# Patient Record
Sex: Male | Born: 1960 | Race: White | Hispanic: No | Marital: Married | Smoking: Never smoker
Health system: Southern US, Community
[De-identification: ages and names within clinical notes are randomized; demographics above are authoritative.]

---

## 2003-06-26 ENCOUNTER — Encounter: Admission: RE | Admit: 2003-06-26 | Discharge: 2003-06-26 | Payer: Self-pay | Admitting: Internal Medicine

## 2004-12-22 IMAGING — CT CT PARANASAL SINUSES LIMITED
1 series · 16 of 28 positions shown, 20 images · non-contrast
Comparison: none

CLINICAL DATA: Persistent sinusitis. 
 CT SINUS LIMITED WITHOUT CONTRAST 
 There are air fluid levels seen within the inferior portions of the maxillary sinuses bilaterally consistent with bilateral maxillary sinusitis.  There is also a small amount of fluid seen within the anterior left sphenoid sinus and mild mucosal thickening seen within the anterior ethmoid sinuses.  The frontal sinuses appear normally aerated.  There are no areas of bone destruction.
 IMPRESSION
 The findings are consistent with bilateral maxillary sinusitis and a small amount of fluid is also seen within the anterior left sphenoid sinus with mucosal thickening within the anterior ethmoid sinuses.

[Series 2: limited sinus · axial · 0.33mm/px · z∈[-8,+91]mm · 16 of 28 slices shown, 20 images]
[im 2/28  brain]
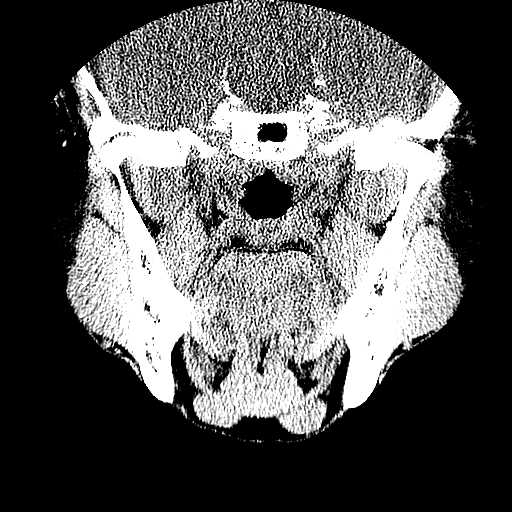
[im 2/28  bone]
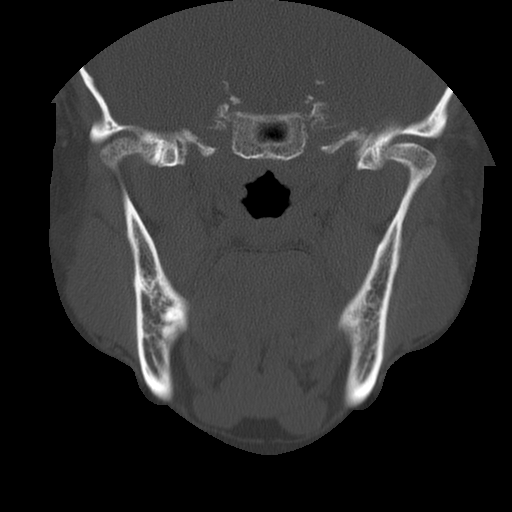
[im 4/28  bone]
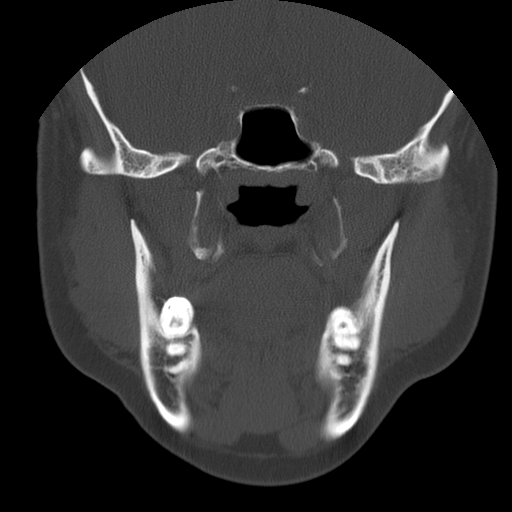
[im 6/28  bone]
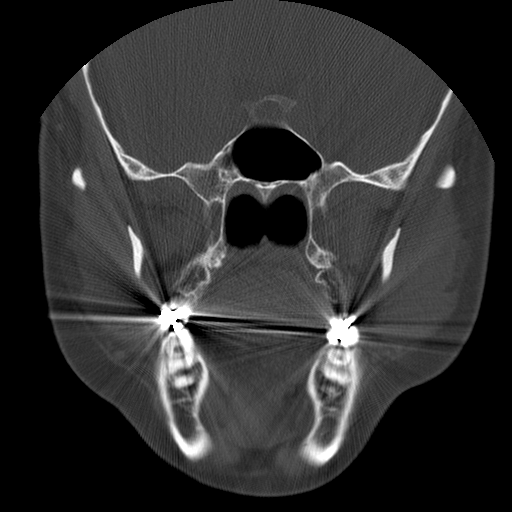
[im 7/28  bone]
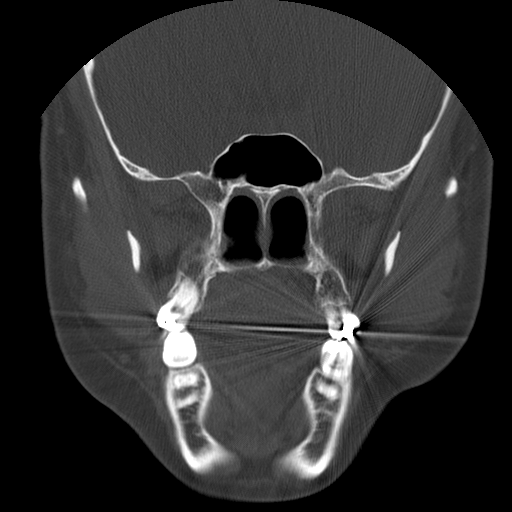
[im 9/28  brain]
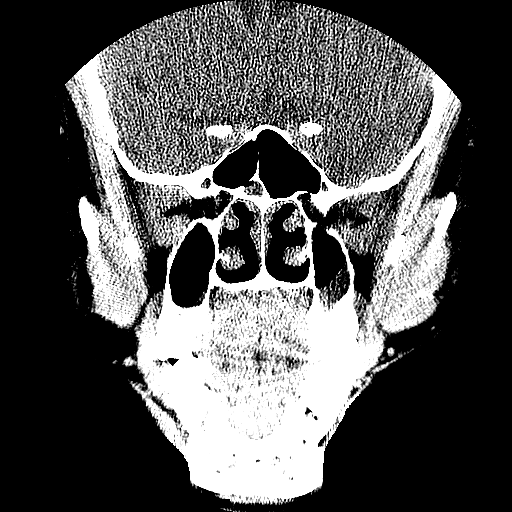
[im 9/28  bone]
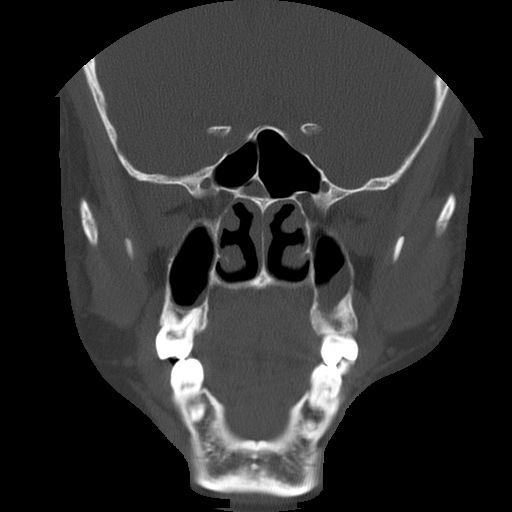
[im 10/28  bone]
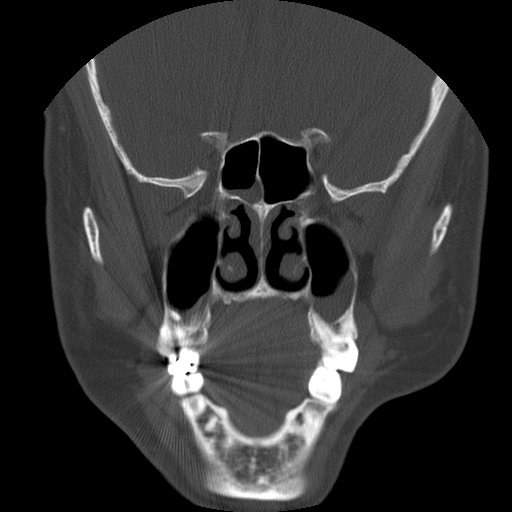
[im 12/28  bone]
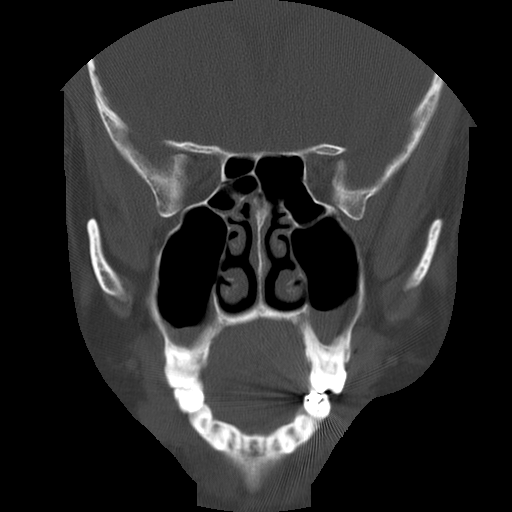
[im 14/28  bone]
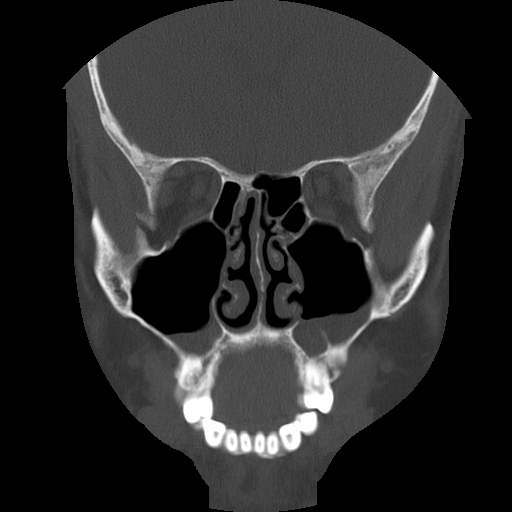
[im 15/28  brain]
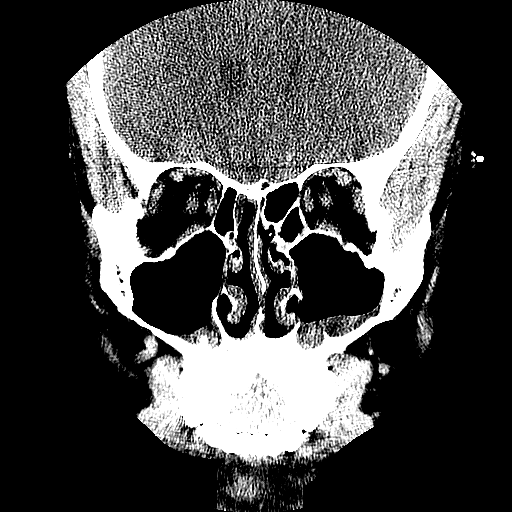
[im 15/28  bone]
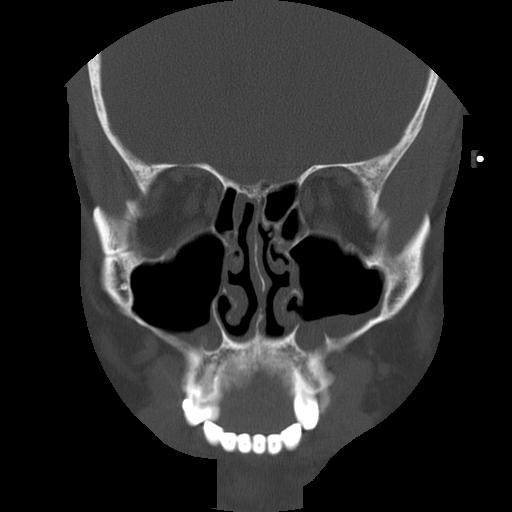
[im 17/28  bone]
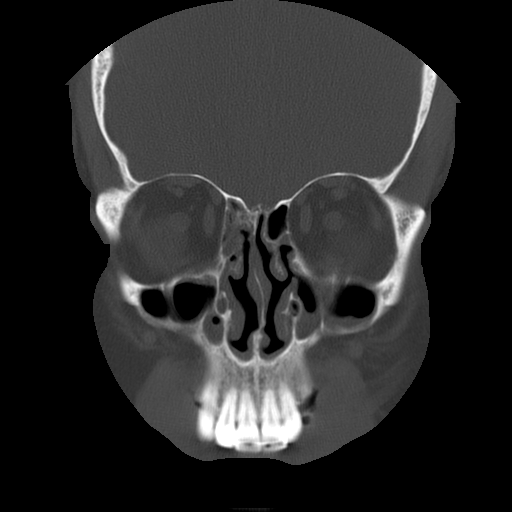
[im 19/28  bone]
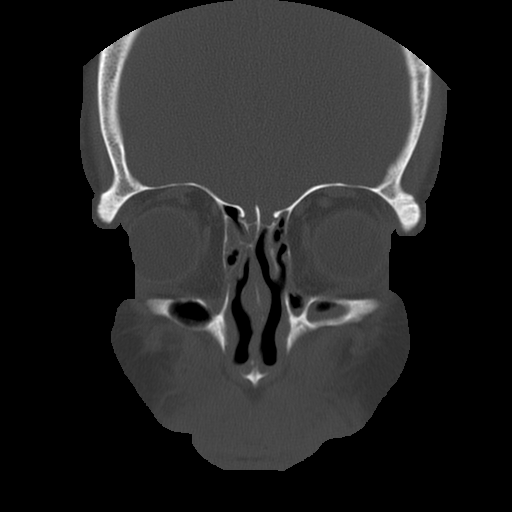
[im 20/28  bone]
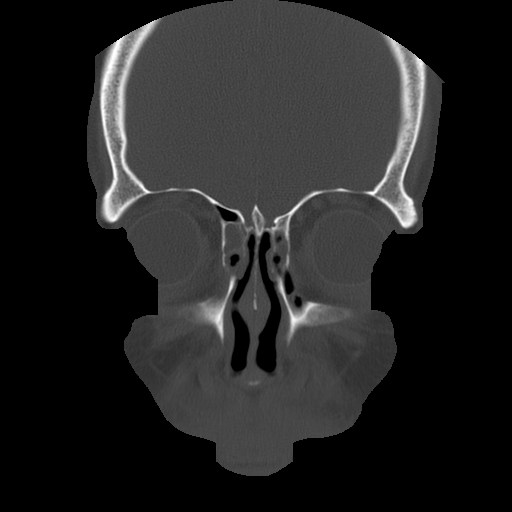
[im 22/28  brain]
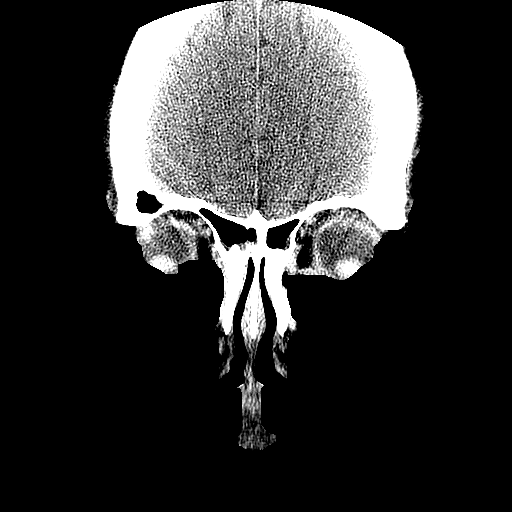
[im 22/28  bone]
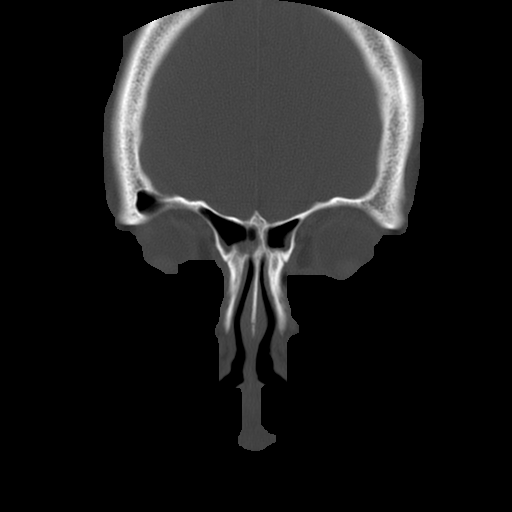
[im 23/28  bone]
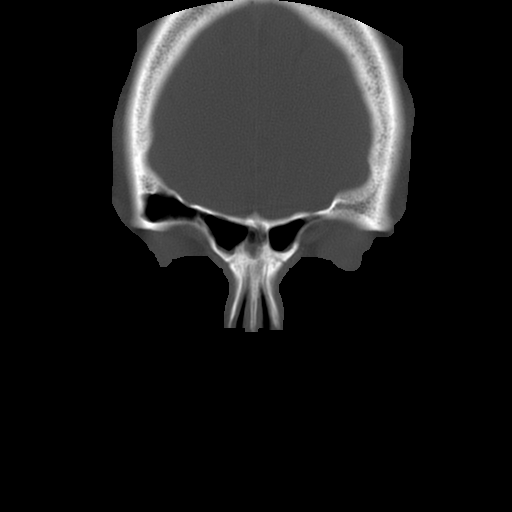
[im 25/28  bone]
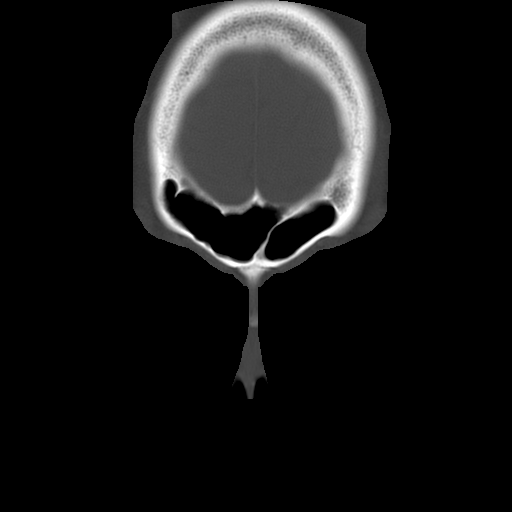
[im 27/28  bone]
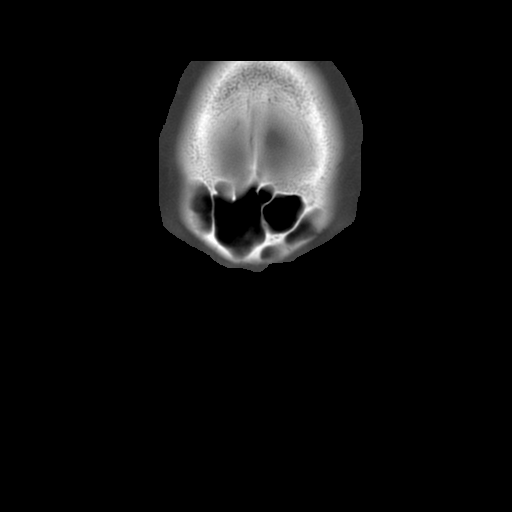

[16 of 28 positions shown; findings below may reference images not displayed]

## 2005-10-06 ENCOUNTER — Ambulatory Visit (HOSPITAL_COMMUNITY): Admission: RE | Admit: 2005-10-06 | Discharge: 2005-10-06 | Payer: Self-pay | Admitting: Cardiology

## 2005-10-20 ENCOUNTER — Ambulatory Visit (HOSPITAL_COMMUNITY): Admission: RE | Admit: 2005-10-20 | Discharge: 2005-10-20 | Payer: Self-pay | Admitting: Cardiology

## 2018-08-15 DIAGNOSIS — I10 Essential (primary) hypertension: Secondary | ICD-10-CM | POA: Diagnosis not present

## 2018-08-15 DIAGNOSIS — F5101 Primary insomnia: Secondary | ICD-10-CM | POA: Diagnosis not present

## 2018-08-15 DIAGNOSIS — J3089 Other allergic rhinitis: Secondary | ICD-10-CM | POA: Diagnosis not present

## 2018-12-18 DIAGNOSIS — R0981 Nasal congestion: Secondary | ICD-10-CM | POA: Diagnosis not present

## 2018-12-18 DIAGNOSIS — R0982 Postnasal drip: Secondary | ICD-10-CM | POA: Diagnosis not present

## 2018-12-18 DIAGNOSIS — I10 Essential (primary) hypertension: Secondary | ICD-10-CM | POA: Diagnosis not present

## 2018-12-18 DIAGNOSIS — E668 Other obesity: Secondary | ICD-10-CM | POA: Diagnosis not present

## 2018-12-18 DIAGNOSIS — Z6839 Body mass index (BMI) 39.0-39.9, adult: Secondary | ICD-10-CM | POA: Diagnosis not present

## 2018-12-18 DIAGNOSIS — J3089 Other allergic rhinitis: Secondary | ICD-10-CM | POA: Diagnosis not present

## 2019-02-18 DIAGNOSIS — R7989 Other specified abnormal findings of blood chemistry: Secondary | ICD-10-CM | POA: Diagnosis not present

## 2019-02-18 DIAGNOSIS — Z0001 Encounter for general adult medical examination with abnormal findings: Secondary | ICD-10-CM | POA: Diagnosis not present

## 2019-02-18 DIAGNOSIS — E782 Mixed hyperlipidemia: Secondary | ICD-10-CM | POA: Diagnosis not present

## 2019-02-18 DIAGNOSIS — Z125 Encounter for screening for malignant neoplasm of prostate: Secondary | ICD-10-CM | POA: Diagnosis not present

## 2019-02-19 DIAGNOSIS — F5101 Primary insomnia: Secondary | ICD-10-CM | POA: Diagnosis not present

## 2019-02-19 DIAGNOSIS — L918 Other hypertrophic disorders of the skin: Secondary | ICD-10-CM | POA: Diagnosis not present

## 2019-02-19 DIAGNOSIS — Z6838 Body mass index (BMI) 38.0-38.9, adult: Secondary | ICD-10-CM | POA: Diagnosis not present

## 2019-02-19 DIAGNOSIS — Z23 Encounter for immunization: Secondary | ICD-10-CM | POA: Diagnosis not present

## 2019-02-19 DIAGNOSIS — I10 Essential (primary) hypertension: Secondary | ICD-10-CM | POA: Diagnosis not present

## 2019-02-19 DIAGNOSIS — Z1211 Encounter for screening for malignant neoplasm of colon: Secondary | ICD-10-CM | POA: Diagnosis not present

## 2019-02-19 DIAGNOSIS — R7301 Impaired fasting glucose: Secondary | ICD-10-CM | POA: Diagnosis not present

## 2019-02-19 DIAGNOSIS — Z0000 Encounter for general adult medical examination without abnormal findings: Secondary | ICD-10-CM | POA: Diagnosis not present

## 2019-02-19 DIAGNOSIS — E782 Mixed hyperlipidemia: Secondary | ICD-10-CM | POA: Diagnosis not present

## 2019-02-19 DIAGNOSIS — Z1331 Encounter for screening for depression: Secondary | ICD-10-CM | POA: Diagnosis not present

## 2019-03-21 DIAGNOSIS — J0101 Acute recurrent maxillary sinusitis: Secondary | ICD-10-CM | POA: Diagnosis not present

## 2019-03-21 DIAGNOSIS — E668 Other obesity: Secondary | ICD-10-CM | POA: Diagnosis not present

## 2019-03-21 DIAGNOSIS — S39012A Strain of muscle, fascia and tendon of lower back, initial encounter: Secondary | ICD-10-CM | POA: Diagnosis not present

## 2019-03-21 DIAGNOSIS — I10 Essential (primary) hypertension: Secondary | ICD-10-CM | POA: Diagnosis not present

## 2019-03-21 DIAGNOSIS — H938X3 Other specified disorders of ear, bilateral: Secondary | ICD-10-CM | POA: Diagnosis not present

## 2019-03-21 DIAGNOSIS — Z6839 Body mass index (BMI) 39.0-39.9, adult: Secondary | ICD-10-CM | POA: Diagnosis not present

## 2019-05-10 DIAGNOSIS — J019 Acute sinusitis, unspecified: Secondary | ICD-10-CM | POA: Diagnosis not present

## 2019-05-10 DIAGNOSIS — Z1389 Encounter for screening for other disorder: Secondary | ICD-10-CM | POA: Diagnosis not present

## 2019-05-10 DIAGNOSIS — Z20822 Contact with and (suspected) exposure to covid-19: Secondary | ICD-10-CM | POA: Diagnosis not present

## 2019-05-10 DIAGNOSIS — Z9889 Other specified postprocedural states: Secondary | ICD-10-CM | POA: Diagnosis not present

## 2019-05-13 DIAGNOSIS — U071 COVID-19: Secondary | ICD-10-CM | POA: Diagnosis not present

## 2019-07-19 DIAGNOSIS — Z23 Encounter for immunization: Secondary | ICD-10-CM | POA: Diagnosis not present

## 2019-08-15 DIAGNOSIS — E782 Mixed hyperlipidemia: Secondary | ICD-10-CM | POA: Diagnosis not present

## 2019-08-16 DIAGNOSIS — Z23 Encounter for immunization: Secondary | ICD-10-CM | POA: Diagnosis not present

## 2019-08-20 DIAGNOSIS — J0101 Acute recurrent maxillary sinusitis: Secondary | ICD-10-CM | POA: Diagnosis not present

## 2019-08-20 DIAGNOSIS — E782 Mixed hyperlipidemia: Secondary | ICD-10-CM | POA: Diagnosis not present

## 2019-08-20 DIAGNOSIS — I10 Essential (primary) hypertension: Secondary | ICD-10-CM | POA: Diagnosis not present

## 2019-08-20 DIAGNOSIS — R7989 Other specified abnormal findings of blood chemistry: Secondary | ICD-10-CM | POA: Diagnosis not present

## 2019-10-03 DIAGNOSIS — H00021 Hordeolum internum right upper eyelid: Secondary | ICD-10-CM | POA: Diagnosis not present

## 2019-12-04 DIAGNOSIS — I10 Essential (primary) hypertension: Secondary | ICD-10-CM | POA: Diagnosis not present

## 2019-12-04 DIAGNOSIS — N400 Enlarged prostate without lower urinary tract symptoms: Secondary | ICD-10-CM | POA: Diagnosis not present

## 2019-12-04 DIAGNOSIS — Z1331 Encounter for screening for depression: Secondary | ICD-10-CM | POA: Diagnosis not present

## 2019-12-04 DIAGNOSIS — Z6841 Body Mass Index (BMI) 40.0 and over, adult: Secondary | ICD-10-CM | POA: Diagnosis not present

## 2020-03-23 DIAGNOSIS — Z13220 Encounter for screening for lipoid disorders: Secondary | ICD-10-CM | POA: Diagnosis not present

## 2020-03-23 DIAGNOSIS — Z0000 Encounter for general adult medical examination without abnormal findings: Secondary | ICD-10-CM | POA: Diagnosis not present

## 2020-03-23 DIAGNOSIS — Z125 Encounter for screening for malignant neoplasm of prostate: Secondary | ICD-10-CM | POA: Diagnosis not present

## 2020-03-25 DIAGNOSIS — Z23 Encounter for immunization: Secondary | ICD-10-CM | POA: Diagnosis not present

## 2020-03-25 DIAGNOSIS — Z6841 Body Mass Index (BMI) 40.0 and over, adult: Secondary | ICD-10-CM | POA: Diagnosis not present

## 2020-03-25 DIAGNOSIS — Z0000 Encounter for general adult medical examination without abnormal findings: Secondary | ICD-10-CM | POA: Diagnosis not present

## 2020-08-19 DIAGNOSIS — R0981 Nasal congestion: Secondary | ICD-10-CM | POA: Diagnosis not present

## 2020-08-19 DIAGNOSIS — J0100 Acute maxillary sinusitis, unspecified: Secondary | ICD-10-CM | POA: Diagnosis not present

## 2020-10-18 DIAGNOSIS — J0100 Acute maxillary sinusitis, unspecified: Secondary | ICD-10-CM | POA: Diagnosis not present

## 2021-03-29 DIAGNOSIS — Z1331 Encounter for screening for depression: Secondary | ICD-10-CM | POA: Diagnosis not present

## 2021-03-29 DIAGNOSIS — Z0000 Encounter for general adult medical examination without abnormal findings: Secondary | ICD-10-CM | POA: Diagnosis not present

## 2021-03-29 DIAGNOSIS — I10 Essential (primary) hypertension: Secondary | ICD-10-CM | POA: Diagnosis not present

## 2021-03-29 DIAGNOSIS — Z23 Encounter for immunization: Secondary | ICD-10-CM | POA: Diagnosis not present

## 2021-03-29 DIAGNOSIS — Z125 Encounter for screening for malignant neoplasm of prostate: Secondary | ICD-10-CM | POA: Diagnosis not present

## 2021-03-29 DIAGNOSIS — Z6841 Body Mass Index (BMI) 40.0 and over, adult: Secondary | ICD-10-CM | POA: Diagnosis not present

## 2021-05-11 DIAGNOSIS — J069 Acute upper respiratory infection, unspecified: Secondary | ICD-10-CM | POA: Diagnosis not present

## 2021-05-11 DIAGNOSIS — R051 Acute cough: Secondary | ICD-10-CM | POA: Diagnosis not present

## 2021-06-02 ENCOUNTER — Encounter: Payer: Self-pay | Admitting: Allergy and Immunology

## 2021-06-02 ENCOUNTER — Ambulatory Visit (INDEPENDENT_AMBULATORY_CARE_PROVIDER_SITE_OTHER): Payer: BC Managed Care – PPO | Admitting: Allergy and Immunology

## 2021-06-02 ENCOUNTER — Other Ambulatory Visit: Payer: Self-pay

## 2021-06-02 VITALS — BP 164/104 | HR 87 | Resp 16 | Ht 67.0 in | Wt 268.0 lb

## 2021-06-02 DIAGNOSIS — J324 Chronic pansinusitis: Secondary | ICD-10-CM | POA: Diagnosis not present

## 2021-06-02 DIAGNOSIS — K219 Gastro-esophageal reflux disease without esophagitis: Secondary | ICD-10-CM

## 2021-06-02 DIAGNOSIS — J3089 Other allergic rhinitis: Secondary | ICD-10-CM

## 2021-06-02 DIAGNOSIS — J301 Allergic rhinitis due to pollen: Secondary | ICD-10-CM

## 2021-06-02 MED ORDER — RYALTRIS 665-25 MCG/ACT NA SUSP: 2.0000 | Freq: Two times a day (BID) | NASAL | 5 refills | Status: AC

## 2021-06-02 MED ORDER — OMEPRAZOLE 40 MG PO CPDR: 40.0000 mg | DELAYED_RELEASE_CAPSULE | Freq: Every morning | ORAL | 5 refills | Status: AC

## 2021-06-02 MED ORDER — FAMOTIDINE 40 MG PO TABS: 40.0000 mg | ORAL_TABLET | Freq: Every evening | ORAL | 5 refills | Status: AC

## 2021-06-02 NOTE — Patient Instructions (Addendum)
°  1.  Blood - CBC w/D, area 2 aeroallergen profile  2. Treat and prevent inflammation during flare up:  A. Ryaltris - 2 sprays each nostril 1-2 times per day (specialty pharmacy)  3. Treat and prevent reflux / LPR during flare up:  A. Omeprazole 40 mg - 1 tablet in AM B. Famotidine 40 mg - 1 tablet in PM C. Eliminate fish oil use  4. If needed:  A. Nasal saline B. Antihistamine   5. Further evaluation and treatment???

## 2021-06-02 NOTE — Progress Notes (Signed)
[place] [facility] [place] [facility] [place] [facility] [place] [facility] [place]   Dear Dr. Helene Kelp,  Thank you for referring Tristan Molina to the [facility] on 06/02/2021.   Below is a summation of this patient's evaluation and recommendations.  Thank you for your referral. I will keep you informed about this patient's response to treatment.   If you have any questions please do not hesitate to contact me.   Sincerely,  Jiles Prows, MD Allergy / Immunology [place]   ______________________________________________________________________    NEW PATIENT NOTE  Referring Provider: Ronita Hipps, MD Primary Provider: Ronita Hipps, MD Date of office visit: 06/02/2021    Subjective:   Chief Complaint:  Tristan Molina (DOB: Jan 19, 1961) is a 61 y.o. male who presents to the clinic on 06/02/2021 with a chief complaint of Allergies .     HPI: Sallie presents to this clinic in evaluation of "sinus" and allergies.  He has a long history of "sinus" which appears to be issues tied up with nasal congestion and cough and tickle in his throat and swollen face and puffy eyes and just not feeling good in general along with some occasional sneezing.  Apparently he required a sinus surgery affecting his frontal sinuses and repair of a deviated septum back in 1987 for this issue.  He states that he receives Kenalog injections twice a year to treat this issue.  He has tried some antihistamines and Flonase in the past which may help him somewhat.  He also appears to have an issue with reflux and LPR.  He has been evaluated by the [facility] group in 2007 and apparently had rhinoscopy performed which identified reflux and he was treated with Nexium for a period in time.  He now uses apple cider vinegar twice a day and he thinks that this results in good control of his reflux.  He does not consume caffeine or eat chocolate or drink  alcohol.  He is received 2 COVID vaccines and was infected with COVID in June 2020 and has received this years flu vaccine.  History reviewed. No pertinent past medical history.  Past Surgical History:  Procedure Laterality Date   BACK SURGERY  2013   CHOLECYSTECTOMY  2006   SINOSCOPY  1987    Allergies as of 06/02/2021   No Known Allergies      Medication List    alfuzosin 10 MG 24 hr tablet Commonly known as: UROXATRAL Take 10 mg by mouth daily.   carvedilol 12.5 MG tablet Commonly known as: COREG Take 25 mg by mouth daily.   cetirizine 10 MG tablet Commonly known as: ZYRTEC Take 10 mg by mouth as needed for allergies.   Fish Oil 1200 MG Caps Take by mouth.   Flaxseed Oil 1200 MG Caps Take by mouth.   Garlic 0000000 MG Caps Take by mouth.   hydrOXYzine 25 MG tablet Commonly known as: ATARAX Take 25 mg by mouth 3 (three) times daily.   losartan 100 MG tablet Commonly known as: COZAAR Take 100 mg by mouth daily.   sildenafil 20 MG tablet Commonly known as: REVATIO SMARTSIG:2 Tablet(s) By Mouth PRN   zolpidem 10 MG tablet Commonly known as: AMBIEN Take 5-10 mg by mouth at bedtime.    Review of systems negative except as noted in HPI / PMHx or noted below:  Review of Systems  Constitutional: Negative.   HENT:  Negative.    Eyes: Negative.   Respiratory: Negative.    Cardiovascular: Negative.   Gastrointestinal: Negative.   Genitourinary: Negative.   Musculoskeletal: Negative.   Skin: Negative.   Neurological: Negative.   Endo/Heme/Allergies: Negative.   Psychiatric/Behavioral: Negative.     Family History  Problem Relation Age of Onset   Allergic rhinitis Mother     Social History   Socioeconomic History   Marital status: Married    Spouse name: Not on file   Number of children: Not on file   Years of education: Not on file   Highest education level: Not on file  Occupational History   Not on file  Tobacco Use   Smoking status: Never    Smokeless tobacco: Never  Substance and Sexual Activity   Alcohol use: Not on file   Drug use: Not on file   Sexual activity: Not on file  Other Topics Concern   Not on file  Social History Narrative   Not on file   Environmental and Social history  Lives in a house with a dry environment, cat and dog located inside the household, hardwood in the bedroom, plastic on the bed, no plastic on the pillow, no smoking ongoing with inside the household.  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  Objective:   Vitals:   06/02/21 1409 06/02/21 1445  BP: (!) 170/96 (!) 164/104  Pulse: 87   Resp: 16   SpO2: 96%    Height: 5\' 7"  (170.2 cm) Weight: 268 lb (121.6 kg)  Physical Exam Constitutional:      Appearance: He is not diaphoretic.  HENT:     Head: Normocephalic.     Right Ear: Tympanic membrane, ear canal and external ear normal.     Left Ear: Tympanic membrane, ear canal and external ear normal.     Nose: Nose normal. No mucosal edema or rhinorrhea.     Mouth/Throat:     Pharynx: Uvula midline. No oropharyngeal exudate.  Eyes:     Conjunctiva/sclera: Conjunctivae normal.  Neck:     Thyroid: No thyromegaly.     Trachea: Trachea normal. No tracheal tenderness or tracheal deviation.  Cardiovascular:     Rate and Rhythm: Normal rate and regular rhythm.     Heart sounds: Normal heart sounds, S1 normal and S2 normal. No murmur heard. Pulmonary:     Effort: No respiratory distress.     Breath sounds: Normal breath sounds. No stridor. No wheezing or rales.  Lymphadenopathy:     Head:     Right side of head: No tonsillar adenopathy.     Left side of head: No tonsillar adenopathy.     Cervical: No cervical adenopathy.  Skin:    Findings: No erythema or rash.     Nails: There is no clubbing.  Neurological:     Mental Status: He is alert.    Diagnostics: Allergy skin tests were not performed secondary to the recent use of an antihistamine.   Assessment and Plan:    1. Perennial  allergic rhinitis   2. Seasonal allergic rhinitis due to pollen   3. Chronic pansinusitis   4. LPRD (laryngopharyngeal reflux disease)     1.  Blood - CBC w/D, area 2 aeroallergen profile  2. Treat and prevent inflammation during flare up:  A. Ryaltris - 2 sprays each nostril 1-2 times per day (specialty pharmacy)  3. Treat and prevent reflux / LPR during flare up:  A. Omeprazole 40 mg -  1 tablet in AM B. Famotidine 40 mg - 1 tablet in PM C. Eliminate fish oil use  4. If needed:  A. Nasal saline B. Antihistamine   5. Further evaluation and treatment???  B[place]ley appears to have a history consistent with allergic disease and some reflux induced respiratory disease.  We will evaluate him for atopic disease with an area to aero allergen IgE profile and provide him some information about allergen avoidance measures.  He has the option of using a combination nasal steroid and nasal antihistamine as noted above and he has the option of utilizing therapy for LPR as noted above.  He is not particularly excited about using medications on a regular basis and we will allow him to use these medications as part of a "action plan" whenever he develops a flareup.  I will contact him with the results of his blood test once they are available for review.  Jiles Prows, MD Allergy / Immunology [facility] [place]

## 2021-06-03 ENCOUNTER — Encounter: Payer: Self-pay | Admitting: Allergy and Immunology

## 2021-11-23 DIAGNOSIS — J0100 Acute maxillary sinusitis, unspecified: Secondary | ICD-10-CM | POA: Diagnosis not present

## 2021-11-23 DIAGNOSIS — M549 Dorsalgia, unspecified: Secondary | ICD-10-CM | POA: Diagnosis not present

## 2022-03-29 DIAGNOSIS — Z131 Encounter for screening for diabetes mellitus: Secondary | ICD-10-CM | POA: Diagnosis not present

## 2022-03-29 DIAGNOSIS — Z0000 Encounter for general adult medical examination without abnormal findings: Secondary | ICD-10-CM | POA: Diagnosis not present

## 2022-03-29 DIAGNOSIS — Z125 Encounter for screening for malignant neoplasm of prostate: Secondary | ICD-10-CM | POA: Diagnosis not present

## 2022-03-30 DIAGNOSIS — Z6841 Body Mass Index (BMI) 40.0 and over, adult: Secondary | ICD-10-CM | POA: Diagnosis not present

## 2022-03-30 DIAGNOSIS — Z23 Encounter for immunization: Secondary | ICD-10-CM | POA: Diagnosis not present

## 2022-03-30 DIAGNOSIS — Z0000 Encounter for general adult medical examination without abnormal findings: Secondary | ICD-10-CM | POA: Diagnosis not present

## 2022-03-30 DIAGNOSIS — Z1331 Encounter for screening for depression: Secondary | ICD-10-CM | POA: Diagnosis not present
